# Patient Record
Sex: Male | Born: 1995 | Race: White | Hispanic: No | Marital: Single | Smoking: Never smoker
Health system: Southern US, Community
[De-identification: ages and names within clinical notes are randomized; demographics above are authoritative.]

## PROBLEM LIST (undated history)

## (undated) DIAGNOSIS — F419 Anxiety disorder, unspecified: Secondary | ICD-10-CM

## (undated) DIAGNOSIS — I471 Supraventricular tachycardia: Secondary | ICD-10-CM

## (undated) DIAGNOSIS — F988 Other specified behavioral and emotional disorders with onset usually occurring in childhood and adolescence: Secondary | ICD-10-CM

## (undated) HISTORY — PX: WISDOM TOOTH EXTRACTION: SHX21

## (undated) HISTORY — PX: KNEE SURGERY: SHX244

## (undated) HISTORY — PX: APPENDECTOMY: SHX54

---
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
## 2017-01-13 ENCOUNTER — Observation Stay (HOSPITAL_BASED_OUTPATIENT_CLINIC_OR_DEPARTMENT_OTHER)
Admission: EM | Admit: 2017-01-13 | Discharge: 2017-01-14 | Disposition: A | Discharge status: Home / Self Care | Payer: 59 | Attending: General Surgery | Admitting: General Surgery

## 2017-01-13 ENCOUNTER — Inpatient Hospital Stay (HOSPITAL_COMMUNITY): Payer: 59 | Admitting: Anesthesiology

## 2017-01-13 ENCOUNTER — Encounter (HOSPITAL_COMMUNITY): Admission: EM | Disposition: A | Discharge status: Home / Self Care | Payer: Self-pay | Source: Home / Self Care

## 2017-01-13 ENCOUNTER — Encounter (HOSPITAL_BASED_OUTPATIENT_CLINIC_OR_DEPARTMENT_OTHER): Payer: Self-pay | Admitting: *Deleted

## 2017-01-13 ENCOUNTER — Emergency Department (HOSPITAL_BASED_OUTPATIENT_CLINIC_OR_DEPARTMENT_OTHER): Payer: 59

## 2017-01-13 DIAGNOSIS — K353 Acute appendicitis with localized peritonitis, without perforation or gangrene: Secondary | ICD-10-CM | POA: Diagnosis present

## 2017-01-13 DIAGNOSIS — F419 Anxiety disorder, unspecified: Secondary | ICD-10-CM | POA: Insufficient documentation

## 2017-01-13 DIAGNOSIS — K3580 Unspecified acute appendicitis: Secondary | ICD-10-CM | POA: Diagnosis present

## 2017-01-13 DIAGNOSIS — Z882 Allergy status to sulfonamides status: Secondary | ICD-10-CM | POA: Diagnosis not present

## 2017-01-13 DIAGNOSIS — F988 Other specified behavioral and emotional disorders with onset usually occurring in childhood and adolescence: Secondary | ICD-10-CM | POA: Diagnosis not present

## 2017-01-13 DIAGNOSIS — K381 Appendicular concretions: Secondary | ICD-10-CM | POA: Insufficient documentation

## 2017-01-13 HISTORY — PX: LAPAROSCOPIC APPENDECTOMY: SHX408

## 2017-01-13 LAB — URINALYSIS, ROUTINE W REFLEX MICROSCOPIC
BILIRUBIN URINE: NEGATIVE
Glucose, UA: NEGATIVE mg/dL
HGB URINE DIPSTICK: NEGATIVE
Ketones, ur: NEGATIVE mg/dL
Leukocytes, UA: NEGATIVE
NITRITE: NEGATIVE
PROTEIN: NEGATIVE mg/dL
SPECIFIC GRAVITY, URINE: 1.024 (ref 1.005–1.030)
pH: 8 (ref 5.0–8.0)

## 2017-01-13 LAB — BASIC METABOLIC PANEL
Anion gap: 7 (ref 5–15)
BUN: 14 mg/dL (ref 6–20)
CALCIUM: 9.3 mg/dL (ref 8.9–10.3)
CO2: 26 mmol/L (ref 22–32)
CREATININE: 0.92 mg/dL (ref 0.61–1.24)
Chloride: 106 mmol/L (ref 101–111)
Glucose, Bld: 119 mg/dL — ABNORMAL HIGH (ref 65–99)
Potassium: 3.6 mmol/L (ref 3.5–5.1)
SODIUM: 139 mmol/L (ref 135–145)

## 2017-01-13 LAB — CBC WITH DIFFERENTIAL/PLATELET
BASOS ABS: 0 10*3/uL (ref 0.0–0.1)
BASOS PCT: 0 %
EOS ABS: 0.1 10*3/uL (ref 0.0–0.7)
EOS PCT: 1 %
HCT: 43.4 % (ref 39.0–52.0)
HEMOGLOBIN: 15.5 g/dL (ref 13.0–17.0)
Lymphocytes Relative: 10 %
Lymphs Abs: 1.3 10*3/uL (ref 0.7–4.0)
MCH: 30.6 pg (ref 26.0–34.0)
MCHC: 35.7 g/dL (ref 30.0–36.0)
MCV: 85.8 fL (ref 78.0–100.0)
Monocytes Absolute: 0.8 10*3/uL (ref 0.1–1.0)
Monocytes Relative: 6 %
NEUTROS PCT: 83 %
Neutro Abs: 10.7 10*3/uL — ABNORMAL HIGH (ref 1.7–7.7)
PLATELETS: 202 10*3/uL (ref 150–400)
RBC: 5.06 MIL/uL (ref 4.22–5.81)
RDW: 12.9 % (ref 11.5–15.5)
WBC: 12.9 10*3/uL — AB (ref 4.0–10.5)

## 2017-01-13 SURGERY — APPENDECTOMY, LAPAROSCOPIC
Anesthesia: General

## 2017-01-13 MED ORDER — SUGAMMADEX SODIUM 200 MG/2ML IV SOLN
INTRAVENOUS | Status: DC | PRN
  Administered 2017-01-13: 170 mg via INTRAVENOUS

## 2017-01-13 MED ORDER — DEXAMETHASONE SODIUM PHOSPHATE 10 MG/ML IJ SOLN
INTRAMUSCULAR | Status: DC | PRN
  Administered 2017-01-13: 10 mg via INTRAVENOUS

## 2017-01-13 MED ORDER — SUCCINYLCHOLINE CHLORIDE 200 MG/10ML IV SOSY
PREFILLED_SYRINGE | INTRAVENOUS | Status: DC | PRN
  Administered 2017-01-13: 160 mg via INTRAVENOUS

## 2017-01-13 MED ORDER — ONDANSETRON HCL 4 MG/2ML IJ SOLN
4.0000 mg | Freq: Three times a day (TID) | INTRAMUSCULAR | Status: DC | PRN
  Administered 2017-01-13: 4 mg via INTRAVENOUS
  Filled 2017-01-13: qty 2

## 2017-01-13 MED ORDER — MIDAZOLAM HCL 2 MG/2ML IJ SOLN
INTRAMUSCULAR | Status: AC
  Filled 2017-01-13: qty 2

## 2017-01-13 MED ORDER — ROCURONIUM BROMIDE 10 MG/ML (PF) SYRINGE
PREFILLED_SYRINGE | INTRAVENOUS | Status: DC | PRN
Start: 2017-01-13 — End: 2017-01-13
  Administered 2017-01-13: 35 mg via INTRAVENOUS
  Administered 2017-01-13: 5 mg via INTRAVENOUS
  Administered 2017-01-13: 10 mg via INTRAVENOUS

## 2017-01-13 MED ORDER — HYDROMORPHONE HCL 1 MG/ML IJ SOLN: 0.2500 mg | INTRAMUSCULAR | Status: DC | PRN

## 2017-01-13 MED ORDER — ENOXAPARIN SODIUM 40 MG/0.4ML ~~LOC~~ SOLN
40.0000 mg | SUBCUTANEOUS | Status: DC
  Administered 2017-01-14: 40 mg via SUBCUTANEOUS
  Filled 2017-01-13: qty 0.4

## 2017-01-13 MED ORDER — FLUOXETINE HCL 20 MG PO CAPS: 40.0000 mg | ORAL_CAPSULE | Freq: Every day | ORAL | Status: DC

## 2017-01-13 MED ORDER — ONDANSETRON HCL 4 MG/2ML IJ SOLN
INTRAMUSCULAR | Status: DC | PRN
  Administered 2017-01-13: 4 mg via INTRAVENOUS

## 2017-01-13 MED ORDER — LACTATED RINGERS IR SOLN
Status: DC | PRN
  Administered 2017-01-13: 1000 mL

## 2017-01-13 MED ORDER — LIDOCAINE 2% (20 MG/ML) 5 ML SYRINGE
INTRAMUSCULAR | Status: DC | PRN
  Administered 2017-01-13: 50 mg via INTRAVENOUS

## 2017-01-13 MED ORDER — HYDROMORPHONE HCL 2 MG/ML IJ SOLN: 1.0000 mg | INTRAMUSCULAR | Status: AC | PRN

## 2017-01-13 MED ORDER — IOPAMIDOL (ISOVUE-300) INJECTION 61%
100.0000 mL | Freq: Once | INTRAVENOUS | Status: AC | PRN
  Administered 2017-01-13: 100 mL via INTRAVENOUS

## 2017-01-13 MED ORDER — 0.9 % SODIUM CHLORIDE (POUR BTL) OPTIME
TOPICAL | Status: DC | PRN
  Administered 2017-01-13: 1000 mL

## 2017-01-13 MED ORDER — ACETAMINOPHEN 10 MG/ML IV SOLN
INTRAVENOUS | Status: DC | PRN
  Administered 2017-01-13: 1000 mg via INTRAVENOUS

## 2017-01-13 MED ORDER — ONDANSETRON HCL 4 MG/2ML IJ SOLN
4.0000 mg | Freq: Once | INTRAMUSCULAR | Status: AC
  Administered 2017-01-13: 4 mg via INTRAVENOUS
  Filled 2017-01-13: qty 2

## 2017-01-13 MED ORDER — PROPOFOL 10 MG/ML IV BOLUS
INTRAVENOUS | Status: AC
  Filled 2017-01-13: qty 20

## 2017-01-13 MED ORDER — MORPHINE SULFATE (PF) 4 MG/ML IV SOLN
4.0000 mg | Freq: Once | INTRAVENOUS | Status: AC
  Administered 2017-01-13: 4 mg via INTRAVENOUS
  Filled 2017-01-13: qty 1

## 2017-01-13 MED ORDER — LACTATED RINGERS IV SOLN
INTRAVENOUS | Status: DC
  Administered 2017-01-13: 1000 mL via INTRAVENOUS

## 2017-01-13 MED ORDER — ACETAMINOPHEN 10 MG/ML IV SOLN
INTRAVENOUS | Status: AC
  Filled 2017-01-13: qty 100

## 2017-01-13 MED ORDER — ACETAMINOPHEN 10 MG/ML IV SOLN: 1000.0000 mg | Freq: Once | INTRAVENOUS | Status: DC

## 2017-01-13 MED ORDER — DEXTROSE 5 % IV SOLN
2.0000 g | Freq: Once | INTRAVENOUS | Status: AC
  Administered 2017-01-13: 2 g via INTRAVENOUS
  Filled 2017-01-13: qty 2

## 2017-01-13 MED ORDER — PROPOFOL 10 MG/ML IV BOLUS
INTRAVENOUS | Status: DC | PRN
  Administered 2017-01-13: 180 mg via INTRAVENOUS

## 2017-01-13 MED ORDER — ONDANSETRON 4 MG PO TBDP: 4.0000 mg | ORAL_TABLET | Freq: Four times a day (QID) | ORAL | Status: DC | PRN

## 2017-01-13 MED ORDER — SODIUM CHLORIDE 0.9 % IV BOLUS (SEPSIS)
1000.0000 mL | Freq: Once | INTRAVENOUS | Status: AC
  Administered 2017-01-13: 1000 mL via INTRAVENOUS

## 2017-01-13 MED ORDER — MEPERIDINE HCL 50 MG/ML IJ SOLN
INTRAMUSCULAR | Status: AC
  Filled 2017-01-13: qty 1

## 2017-01-13 MED ORDER — HYDROCODONE-ACETAMINOPHEN 5-325 MG PO TABS
1.0000 | ORAL_TABLET | ORAL | Status: DC | PRN
  Administered 2017-01-13 – 2017-01-14 (×3): 2 via ORAL
  Filled 2017-01-13 (×3): qty 2

## 2017-01-13 MED ORDER — LACTATED RINGERS IV SOLN
INTRAVENOUS | Status: DC | PRN
  Administered 2017-01-13: 18:00:00 via INTRAVENOUS

## 2017-01-13 MED ORDER — MIDAZOLAM HCL 5 MG/5ML IJ SOLN
INTRAMUSCULAR | Status: DC | PRN
  Administered 2017-01-13: 2 mg via INTRAVENOUS

## 2017-01-13 MED ORDER — FENTANYL CITRATE (PF) 100 MCG/2ML IJ SOLN
INTRAMUSCULAR | Status: DC | PRN
  Administered 2017-01-13 (×2): 50 ug via INTRAVENOUS
  Administered 2017-01-13: 100 ug via INTRAVENOUS
  Administered 2017-01-13: 50 ug via INTRAVENOUS

## 2017-01-13 MED ORDER — FENTANYL CITRATE (PF) 250 MCG/5ML IJ SOLN
INTRAMUSCULAR | Status: AC
  Filled 2017-01-13: qty 5

## 2017-01-13 MED ORDER — METRONIDAZOLE 500 MG PO TABS
500.0000 mg | ORAL_TABLET | Freq: Three times a day (TID) | ORAL | Status: DC
  Administered 2017-01-13: 500 mg via ORAL
  Filled 2017-01-13: qty 1

## 2017-01-13 MED ORDER — HYDROMORPHONE HCL 1 MG/ML IJ SOLN
1.0000 mg | INTRAMUSCULAR | Status: DC | PRN
  Administered 2017-01-13: 1 mg via INTRAVENOUS
  Filled 2017-01-13: qty 1

## 2017-01-13 MED ORDER — DEXTROSE 5 % IV SOLN
1.0000 g | Freq: Once | INTRAVENOUS | Status: DC
  Filled 2017-01-13: qty 10

## 2017-01-13 MED ORDER — BUPIVACAINE HCL (PF) 0.25 % IJ SOLN
INTRAMUSCULAR | Status: DC | PRN
  Administered 2017-01-13: 15 mL

## 2017-01-13 MED ORDER — ONDANSETRON HCL 4 MG/2ML IJ SOLN: 4.0000 mg | Freq: Four times a day (QID) | INTRAMUSCULAR | Status: DC | PRN

## 2017-01-13 MED ORDER — BUPIVACAINE HCL (PF) 0.25 % IJ SOLN
INTRAMUSCULAR | Status: AC
  Filled 2017-01-13: qty 30

## 2017-01-13 MED ORDER — MEPERIDINE HCL 25 MG/ML IJ SOLN
INTRAMUSCULAR | Status: DC | PRN
  Administered 2017-01-13: 25 mg via INTRAVENOUS

## 2017-01-13 SURGICAL SUPPLY — 33 items
APPLIER CLIP 5 13 M/L LIGAMAX5 (MISCELLANEOUS)
APPLIER CLIP ROT 10 11.4 M/L (STAPLE)
CABLE HIGH FREQUENCY MONO STRZ (ELECTRODE) IMPLANT
CHLORAPREP W/TINT 26ML (MISCELLANEOUS) ×3 IMPLANT
CLIP APPLIE 5 13 M/L LIGAMAX5 (MISCELLANEOUS) IMPLANT
CLIP APPLIE ROT 10 11.4 M/L (STAPLE) IMPLANT
COVER SURGICAL LIGHT HANDLE (MISCELLANEOUS) IMPLANT
CUTTER FLEX LINEAR 45M (STAPLE) ×3 IMPLANT
DECANTER SPIKE VIAL GLASS SM (MISCELLANEOUS) ×3 IMPLANT
DRAPE LAPAROSCOPIC ABDOMINAL (DRAPES) ×3 IMPLANT
ELECT REM PT RETURN 9FT ADLT (ELECTROSURGICAL) ×3
ELECTRODE REM PT RTRN 9FT ADLT (ELECTROSURGICAL) ×1 IMPLANT
GLOVE BIOGEL PI IND STRL 7.5 (GLOVE) ×1 IMPLANT
GLOVE BIOGEL PI INDICATOR 7.5 (GLOVE) ×2
GLOVE ECLIPSE 7.5 STRL STRAW (GLOVE) ×3 IMPLANT
GOWN STRL REUS W/TWL XL LVL3 (GOWN DISPOSABLE) ×6 IMPLANT
IRRIG SUCT STRYKERFLOW 2 WTIP (MISCELLANEOUS) ×3
IRRIGATION SUCT STRKRFLW 2 WTP (MISCELLANEOUS) ×1 IMPLANT
KIT BASIN OR (CUSTOM PROCEDURE TRAY) ×3 IMPLANT
LIQUID BAND (GAUZE/BANDAGES/DRESSINGS) ×3 IMPLANT
POUCH SPECIMEN RETRIEVAL 10MM (ENDOMECHANICALS) ×3 IMPLANT
RELOAD 45 VASCULAR/THIN (ENDOMECHANICALS) IMPLANT
RELOAD STAPLE TA45 3.5 REG BLU (ENDOMECHANICALS) ×3 IMPLANT
SCISSORS LAP 5X35 DISP (ENDOMECHANICALS) ×3 IMPLANT
SHEARS HARMONIC ACE PLUS 36CM (ENDOMECHANICALS) ×3 IMPLANT
SLEEVE XCEL OPT CAN 5 100 (ENDOMECHANICALS) ×3 IMPLANT
SUT MNCRL AB 4-0 PS2 18 (SUTURE) ×3 IMPLANT
TOWEL OR 17X26 10 PK STRL BLUE (TOWEL DISPOSABLE) ×3 IMPLANT
TRAY FOLEY W/METER SILVER 16FR (SET/KITS/TRAYS/PACK) ×3 IMPLANT
TRAY LAPAROSCOPIC (CUSTOM PROCEDURE TRAY) ×3 IMPLANT
TROCAR BLADELESS OPT 5 100 (ENDOMECHANICALS) ×3 IMPLANT
TROCAR XCEL BLUNT TIP 100MML (ENDOMECHANICALS) ×3 IMPLANT
TUBING INSUF HEATED (TUBING) ×3 IMPLANT

## 2017-01-13 NOTE — ED Notes (Signed)
Family at bedside. 

## 2017-01-13 NOTE — Anesthesia Postprocedure Evaluation (Signed)
Anesthesia Post Note  Patient: Andre ButtersJohn Decarli  Procedure(s) Performed: Procedure(s) (LRB): APPENDECTOMY LAPAROSCOPIC (N/A)  Patient location during evaluation: PACU Anesthesia Type: General Level of consciousness: awake Pain management: pain level controlled Vital Signs Assessment: post-procedure vital signs reviewed and stable Respiratory status: spontaneous breathing Cardiovascular status: stable Anesthetic complications: no       Last Vitals:  Vitals:   01/13/17 1900 01/13/17 1915  BP: 121/64 127/75  Pulse: 91 83  Resp: 19 17  Temp:      Last Pain:  Vitals:   01/13/17 1900  TempSrc:   PainSc: Asleep                 Kaari Zeigler

## 2017-01-13 NOTE — ED Triage Notes (Signed)
Woke with abdominal pain. No nausea, diarrhea or vomiting.

## 2017-01-13 NOTE — Anesthesia Procedure Notes (Signed)
Procedure Name: Intubation Performed by: Anne Fu Pre-anesthesia Checklist: Patient identified, Emergency Drugs available, Suction available, Patient being monitored and Timeout performed Patient Re-evaluated:Patient Re-evaluated prior to inductionOxygen Delivery Method: Circle system utilized Preoxygenation: Pre-oxygenation with 100% oxygen Intubation Type: IV induction, Rapid sequence and Cricoid Pressure applied Laryngoscope Size: Mac and 4 Grade View: Grade I Tube type: Oral Tube size: 7.5 mm Number of attempts: 1 Airway Equipment and Method: Stylet Placement Confirmation: ETT inserted through vocal cords under direct vision,  positive ETCO2,  CO2 detector and breath sounds checked- equal and bilateral Secured at: 23 cm Tube secured with: Tape Dental Injury: Teeth and Oropharynx as per pre-operative assessment

## 2017-01-13 NOTE — ED Notes (Signed)
ED Provider at bedside. 

## 2017-01-13 NOTE — ED Notes (Signed)
[facility] here for transport 

## 2017-01-13 NOTE — Anesthesia Preprocedure Evaluation (Addendum)
Anesthesia Evaluation  Patient identified by MRN, date of birth, ID band Patient awake    Reviewed: Allergy & Precautions, NPO status , Patient's Chart, lab work & pertinent test results  Airway Mallampati: I  TM Distance: >3 FB     Dental   Pulmonary neg pulmonary ROS,    breath sounds clear to auscultation       Cardiovascular negative cardio ROS   Rhythm:Regular Rate:Normal     Neuro/Psych    GI/Hepatic Neg liver ROS, History noted. CG   Endo/Other  negative endocrine ROS  Renal/GU negative Renal ROS     Musculoskeletal   Abdominal   Peds  Hematology   Anesthesia Other Findings   Reproductive/Obstetrics                             Anesthesia Physical Anesthesia Plan  ASA: I and emergent  Anesthesia Plan: General   Post-op Pain Management:    Induction: Intravenous, Rapid sequence and Cricoid pressure planned  Airway Management Planned: Oral ETT  Additional Equipment:   Intra-op Plan:   Post-operative Plan: Extubation in OR  Informed Consent: I have reviewed the patients History and Physical, chart, labs and discussed the procedure including the risks, benefits and alternatives for the proposed anesthesia with the patient or authorized representative who has indicated his/her understanding and acceptance.   Dental advisory given  Plan Discussed with: CRNA, Anesthesiologist and Surgeon  Anesthesia Plan Comments:         Anesthesia Quick Evaluation

## 2017-01-13 NOTE — H&P (Signed)
Andre Peterson is an 21 y.o. male.    Chief Complaint: Abdominal pain HPI: Patient is a generally healthy 21 year old male who woke up this morning with the gradual onset of worsening mid abdominal pain that has become more localized in the lower right side. This is associated with some nausea but no vomiting. Worse with motion. He has no history of any similar or recurring pain or any chronic GI complaints. No change in bowel habits or GU symptoms.  Past Medical History:  Diagnosis Date  . Anxiety   . Attention deficit disorder     Past Surgical History:  Procedure Laterality Date  . KNEE SURGERY      History reviewed. No pertinent family history. Social History:  reports that he has never smoked. He has never used smokeless tobacco. He reports that he does not drink alcohol. His drug history is not on file.  Allergies:  Allergies  Allergen Reactions  . Sulfa Antibiotics     Using eyedrops and his eyes became itchy    Medications Prior to Admission  Medication Sig Dispense Refill  . FLUoxetine (PROZAC) 40 MG capsule Take 40 mg by mouth daily.    . naphazoline-pheniramine (NAPHCON-A) 0.025-0.3 % ophthalmic solution Place 1 drop into both eyes every 6 (six) hours as needed for irritation. 15 mL 0    Results for orders placed or performed during the hospital encounter of 01/13/17 (from the past 48 hour(s))  Urinalysis, Routine w reflex microscopic     Status: None   Collection Time: 01/13/17  1:25 PM  Result Value Ref Range   Color, Urine YELLOW YELLOW   APPearance CLEAR CLEAR   Specific Gravity, Urine 1.024 1.005 - 1.030   pH 8.0 5.0 - 8.0   Glucose, UA NEGATIVE NEGATIVE mg/dL   Hgb urine dipstick NEGATIVE NEGATIVE   Bilirubin Urine NEGATIVE NEGATIVE   Ketones, ur NEGATIVE NEGATIVE mg/dL   Protein, ur NEGATIVE NEGATIVE mg/dL   Nitrite NEGATIVE NEGATIVE   Leukocytes, UA NEGATIVE NEGATIVE    Comment: Microscopic not done on urines with negative protein, blood, leukocytes,  nitrite, or glucose < 500 mg/dL.  Basic metabolic panel     Status: Abnormal   Collection Time: 01/13/17  2:09 PM  Result Value Ref Range   Sodium 139 135 - 145 mmol/L   Potassium 3.6 3.5 - 5.1 mmol/L   Chloride 106 101 - 111 mmol/L   CO2 26 22 - 32 mmol/L   Glucose, Bld 119 (H) 65 - 99 mg/dL   BUN 14 6 - 20 mg/dL   Creatinine, Ser 0.92 0.61 - 1.24 mg/dL   Calcium 9.3 8.9 - 10.3 mg/dL   GFR calc non Af Amer >60 >60 mL/min   GFR calc Af Amer >60 >60 mL/min    Comment: (NOTE) The eGFR has been calculated using the CKD EPI equation. This calculation has not been validated in all clinical situations. eGFR's persistently <60 mL/min signify possible Chronic Kidney Disease.    Anion gap 7 5 - 15  CBC with Differential     Status: Abnormal   Collection Time: 01/13/17  2:09 PM  Result Value Ref Range   WBC 12.9 (H) 4.0 - 10.5 K/uL   RBC 5.06 4.22 - 5.81 MIL/uL   Hemoglobin 15.5 13.0 - 17.0 g/dL   HCT 43.4 39.0 - 52.0 %   MCV 85.8 78.0 - 100.0 fL   MCH 30.6 26.0 - 34.0 pg   MCHC 35.7 30.0 - 36.0 g/dL   RDW  12.9 11.5 - 15.5 %   Platelets 202 150 - 400 K/uL   Neutrophils Relative % 83 %   Neutro Abs 10.7 (H) 1.7 - 7.7 K/uL   Lymphocytes Relative 10 %   Lymphs Abs 1.3 0.7 - 4.0 K/uL   Monocytes Relative 6 %   Monocytes Absolute 0.8 0.1 - 1.0 K/uL   Eosinophils Relative 1 %   Eosinophils Absolute 0.1 0.0 - 0.7 K/uL   Basophils Relative 0 %   Basophils Absolute 0.0 0.0 - 0.1 K/uL   Ct Abdomen Pelvis W Contrast  Result Date: 01/13/2017 CLINICAL DATA:  Lower abdominal pain starting early this morning, nausea EXAM: CT ABDOMEN AND PELVIS WITH CONTRAST TECHNIQUE: Multidetector CT imaging of the abdomen and pelvis was performed using the standard protocol following bolus administration of intravenous contrast. CONTRAST:  156m ISOVUE-300 IOPAMIDOL (ISOVUE-300) INJECTION 61% COMPARISON:  None. FINDINGS: Lower chest: Lung bases shows no acute findings. Hepatobiliary: Enhanced liver shows no  focal mass. No biliary ductal dilatation. Gallbladder is contracted without evidence of calcified gallstones. Pancreas: Enhanced pancreas with normal appearance. No surrounding inflammatory changes. Spleen: Enhanced spleen without acute findings. Adrenals/Urinary Tract: No adrenal gland mass. Enhanced kidneys are symmetrical in size. No hydronephrosis or hydroureter. Stomach/Bowel: There is no gastric outlet obstruction. Oral contrast material was given to the patient. Axial image 65 mild fluid distended small bowel loops in right lower quadrant probable mild ileus. No transition point in caliber of small bowel to suggest small bowel obstruction. Although the base of the appendix has normal appearance in coronal image 38 the distal appendix is thickened mild distended with fluid measures up to 8 mm in diameter. Please see coronal image 39. There is a calcified appendicolith within mid appendiceal lumen axial image 62 measures 3.5 mm. Findings are consistent with early tip appendicitis. Vascular/Lymphatic: No aortic aneurysm.  No adenopathy. Reproductive: Prostate gland and seminal vesicles are unremarkable. Limited assessment of urinary bladder which is empty. Other: No ascites or free abdominal air.  No inguinal adenopathy. Musculoskeletal: No destructive bony lesions are noted. Sagittal images of the spine are unremarkable. IMPRESSION: 1. There is mild distended distal appendix with fluid and minimal stranding of surrounding fat. Mild enhancement of the wall of distal appendix .A calcified appendicolith is noted in mid appendix measures 3.5 mm. Findings are consistent with early tip appendicitis. Distal appendix measures 8 mm in diameter. The proximal appendix/ base has normal appearance. No evidence of perforation. 2. Mild fluid distended small bowel loops in right lower abdomen probable mild ileus. No evidence of small bowel obstruction. 3. No hydronephrosis or hydroureter. 4. Limited assessment of urinary  bladder which is empty. These results were called by telephone at the time of interpretation on 01/13/2017 at 2:53 pm to Dr. SSherwood Gambler, who verbally acknowledged these results. Electronically Signed   By: LLahoma CrockerM.D.   On: 01/13/2017 14:54    Review of Systems  Constitutional: Negative for chills and fever.  Respiratory: Negative.   Cardiovascular: Negative.   Gastrointestinal: Positive for abdominal pain and nausea. Negative for vomiting.  Genitourinary: Negative.     Blood pressure 131/72, pulse 84, temperature 98.1 F (36.7 C), temperature source Oral, resp. rate 18, height '5\' 11"'$  (1.803 m), weight 85.7 kg (189 lb), SpO2 98 %. Physical Exam  General: Alert, well-developed young Caucasian male, in no distress Skin: Warm and dry without rash or infection. HEENT: No palpable masses or thyromegaly. Sclera nonicteric. Pupils equal round and reactive. Lymph nodes: No cervical,  supraclavicular,nodes palpable. Lungs: Breath sounds clear and equal without increased work of breathing Cardiovascular: Regular rate and rhythm without murmur. No JVD or edema. Peripheral pulses intact. Abdomen: Nondistended. There is localized right lower quadrant tenderness with guarding. No masses palpable. No organomegaly. No palpable hernias. Extremities: No edema or joint swelling or deformity. No chronic venous stasis changes. Neurologic: Alert and fully oriented. Affect normal. No gross motor abnormalities.  Assessment/Plan Physical findings, history, labs and CT scan all consistent with acute appendicitis. I discussed options for treatment including antibiotics and surgery. I think laparoscopic appendectomy would be the most appropriate treatment and he is in agreement. We discussed the nature of surgery and recovery as well as risks of anesthetic complications, bleeding, infection or possible need for conversion to open surgery. All questions were answered and he desires to proceed. He has received  preoperative broad-spectrum IV antibiotics.  Edward Jolly, MD 01/13/2017, 5:29 PM

## 2017-01-13 NOTE — Op Note (Signed)
Preoperative Diagnosis: Acute appendicitis, unspecified acute appendicitis type [K35.80] Acute appendicitis [K35.80]  Postoprative Diagnosis: Acute appendicitis, unspecified acute appendicitis type [K35.80] Acute appendicitis [K35.80]  Procedure: Procedure(s): APPENDECTOMY LAPAROSCOPIC   Surgeon: Glenna FellowsHoxworth, Rishawn Walck T   Assistants: None  Anesthesia:  General endotracheal anesthesia  Indications: Patient is a 21 year old male with typical vertical presentation for acute appendicitis which has been confirmed by CT scan. Following a preoperative discussion detailed elsewhere we have elected to proceed with laparoscopic appendectomy.    Procedure Detail: Patient was brought to the operating room, placed in the supine position on the operating table, and general endotracheal anesthesia induced. He received preoperative IV antibiotics. Foley catheter was placed. The abdomen was widely sterilely prepped and draped. Patient timeout was performed and correct procedure verified. Trocar sites were infiltrated with local anesthesia. Access was attained with a 1/2 cm incision at the umbilicus with an open Hassan technique through a mattress suture of 0 Vicryl and pneumoperitoneum established. Under direct vision 5 mm trochars were placed in the left lower quadrant and upper midline. The appendix was lying anterior and medial and was acutely inflamed with exudate but no gangrene or perforation. The base was relatively uninflamed. The appendix was mobilized dividing lateral peritoneal attachments with the Harmonic scalpel. The mesial appendix was then sequentially divided with the Harmonic scalpel until the appendix was completely freed down to the tip of the cecum. The appendix was divided from the tip of the cecum with a single firing of the Endo GIA 45 mm blue load stapler. The staple line was intact and without bleeding. The right lower quadrant was thoroughly irrigated until clear. There was no evidence of  bleeding or injury or other problems. The appendix was placed in an Endo Catch bag and brought out through the umbilical incision. This incision was closed with a mattress suture and all CO2 evacuated and trochars removed. Skin incisions were closed with subcuticular 4-0 Monocryl and Liquiban. Sponge needle and instrument counts were correct.    Findings: Acute appendicitis without perforation or gangrene  Estimated Blood Loss:  Minimal         Drains: None  Blood Given: none          Specimens: Appendix        Complications:  * No complications entered in OR log *         Disposition: PACU - hemodynamically stable.         Condition: stable

## 2017-01-13 NOTE — Transfer of Care (Signed)
Immediate Anesthesia Transfer of Care Note  Patient: Andre ButtersJohn Peterson  Procedure(s) Performed: Procedure(s): APPENDECTOMY LAPAROSCOPIC (N/A)  Patient Location: PACU  Anesthesia Type:General  Level of Consciousness: awake, alert , oriented and patient cooperative  Airway & Oxygen Therapy: Patient Spontanous Breathing and Patient connected to face mask oxygen  Post-op Assessment: Report given to RN, Post -op Vital signs reviewed and stable and Patient moving all extremities X 4  Post vital signs: stable  Last Vitals:  Vitals:   01/13/17 1704 01/13/17 1847  BP: 131/72 (!) (P) 138/92  Pulse: 84 (!) 117  Resp: 18 16  Temp: 36.7 C (P) 37.2 C    Last Pain:  Vitals:   01/13/17 1704  TempSrc: Oral  PainSc: 3          Complications: No apparent anesthesia complications

## 2017-01-13 NOTE — Progress Notes (Signed)
Pt arrived at 1705 via [facility] and was then picked up by the OR at 1713. Admission work done as much as possible. Mother and father at bedside.

## 2017-01-13 NOTE — ED Provider Notes (Signed)
[facility] Provider [facility]   CSN: [id number] Arrival date & time: 01/13/17  1315     History   Chief Complaint Chief Complaint  Patient presents with  . Abdominal Pain    HPI Andre Peterson is a 21 y.o. male.  HPI  21 year old male presents with abdominal pain since waking up around 7 AM. Progressively worsened. He did eat this morning and states around 30 minutes later seemed to be worse. He had a bowel movement just prior to arrival but this did not change the pain. He has not had constipation or diarrhea recently. No urinary symptoms. He's had some nausea that he thinks his pain related. No vomiting. No back pain or fevers. Last night he went to bed feeling well. The pain is presently worsened and is now about a 7 or 8 out of 10. He tried a acid reliever with no relief. Pain feels sharp and stabbing. He points to just inferior to his umbilicus on both sides as the source of his pain.  Past Medical History:  Diagnosis Date  . Anxiety   . Attention deficit disorder     Patient Active Problem List   Diagnosis Date Noted  . Acute appendicitis 01/13/2017    Past Surgical History:  Procedure Laterality Date  . KNEE SURGERY         Home Medications    Prior to Admission medications   Medication Sig Start Date End Date Taking? Authorizing Provider  FLUoxetine (PROZAC) 40 MG capsule Take 40 mg by mouth daily.    Historical Provider, MD  naphazoline-pheniramine (NAPHCON-A) 0.025-0.3 % ophthalmic solution Place 1 drop into both eyes every 6 (six) hours as needed for irritation. 06/26/14   Hope Orlene OchM Neese, NP    Family History No family history on file.  Social History Social History  Substance Use Topics  . Smoking status: Never Smoker  . Smokeless tobacco: Never Used  . Alcohol use No     Allergies   Sulfa antibiotics   Review of Systems Review of Systems  Constitutional: Negative for fever.  Gastrointestinal: Positive for abdominal pain and nausea.  Negative for vomiting.  Genitourinary: Negative for discharge, dysuria, hematuria, penile pain, penile swelling, scrotal swelling and testicular pain.  Musculoskeletal: Negative for back pain.  All other systems reviewed and are negative.    Physical Exam Updated Vital Signs BP 125/65   Pulse 67   Temp 98 F (36.7 C) (Oral)   Resp 20   Ht 5\' 11"  (1.803 m)   Wt 189 lb (85.7 kg)   SpO2 100%   BMI 26.36 kg/m   Physical Exam  Constitutional: He is oriented to person, place, and time. He appears well-developed and well-nourished.  HENT:  Head: Normocephalic and atraumatic.  Right Ear: External ear normal.  Left Ear: External ear normal.  Nose: Nose normal.  Eyes: Right eye exhibits no discharge. Left eye exhibits no discharge.  Neck: Neck supple.  Cardiovascular: Normal rate, regular rhythm and normal heart sounds.   Pulmonary/Chest: Effort normal and breath sounds normal.  Abdominal: Soft. There is tenderness (worst in RLQ) in the right lower quadrant, suprapubic area and left lower quadrant. There is no CVA tenderness.  Genitourinary: Testes normal and penis normal. Right testis shows no mass, no swelling and no tenderness. Left testis shows no mass, no swelling and no tenderness. Circumcised. No penile tenderness. No discharge found.  Musculoskeletal: He exhibits no edema.  Neurological: He is alert and oriented to person, place, and  time.  Skin: Skin is warm and dry.  Nursing note and vitals reviewed.    ED Treatments / Results  Labs (all labs ordered are listed, but only abnormal results are displayed) Labs Reviewed  BASIC METABOLIC PANEL - Abnormal; Notable for the following:       Result Value   Glucose, Bld 119 (*)    All other components within normal limits  CBC WITH DIFFERENTIAL/PLATELET - Abnormal; Notable for the following:    WBC 12.9 (*)    Neutro Abs 10.7 (*)    All other components within normal limits  URINALYSIS, ROUTINE W REFLEX MICROSCOPIC     EKG  EKG Interpretation None       Radiology Ct Abdomen Pelvis W Contrast  Result Date: 01/13/2017 CLINICAL DATA:  Lower abdominal pain starting early this morning, nausea EXAM: CT ABDOMEN AND PELVIS WITH CONTRAST TECHNIQUE: Multidetector CT imaging of the abdomen and pelvis was performed using the standard protocol following bolus administration of intravenous contrast. CONTRAST:  ISOVUE-300 IOPAMIDOL (ISOVUE-300) INJECTION 61% COMPARISON:  None. FINDINGS: Lower chest: Lung bases shows no acute findings. Hepatobiliary: Enhanced liver shows no focal mass. No biliary ductal dilatation. Gallbladder is contracted without evidence of calcified gallstones. Pancreas: Enhanced pancreas with normal appearance. No surrounding inflammatory changes. Spleen: Enhanced spleen without acute findings. Adrenals/Urinary Tract: No adrenal gland mass. Enhanced kidneys are symmetrical in size. No hydronephrosis or hydroureter. Stomach/Bowel: There is no gastric outlet obstruction. Oral contrast material was given to the patient. Axial image 65 mild fluid distended small bowel loops in right lower quadrant probable mild ileus. No transition point in caliber of small bowel to suggest small bowel obstruction. Although the base of the appendix has normal appearance in coronal image 38 the distal appendix is thickened mild distended with fluid measures up to 8 mm in diameter. Please see coronal image 39. There is a calcified appendicolith within mid appendiceal lumen axial image 62 measures 3.5 mm. Findings are consistent with early tip appendicitis. Vascular/Lymphatic: No aortic aneurysm.  No adenopathy. Reproductive: Prostate gland and seminal vesicles are unremarkable. Limited assessment of urinary bladder which is empty. Other: No ascites or free abdominal air.  No inguinal adenopathy. Musculoskeletal: No destructive bony lesions are noted. Sagittal images of the spine are unremarkable. IMPRESSION: 1. There is mild  distended distal appendix with fluid and minimal stranding of surrounding fat. Mild enhancement of the wall of distal appendix .A calcified appendicolith is noted in mid appendix measures 3.5 mm. Findings are consistent with early tip appendicitis. Distal appendix measures 8 mm in diameter. The proximal appendix/ base has normal appearance. No evidence of perforation. 2. Mild fluid distended small bowel loops in right lower abdomen probable mild ileus. No evidence of small bowel obstruction. 3. No hydronephrosis or hydroureter. 4. Limited assessment of urinary bladder which is empty. These results were called by telephone at the time of interpretation on 01/13/2017 at 2:53 pm to Dr. Pricilla Loveless , who verbally acknowledged these results. Electronically Signed   By: Natasha Mead M.D.   On: 01/13/2017 14:54    Procedures Procedures (including critical care time)  Medications Ordered in ED Medications  cefTRIAXone (ROCEPHIN) 2 g in dextrose 5 % 50 mL IVPB (2 g Intravenous New Bag/Given 01/13/17 1518)    And  metroNIDAZOLE (FLAGYL) tablet 500 mg (500 mg Oral Given 01/13/17 1518)  ondansetron (ZOFRAN) injection 4 mg (4 mg Intravenous Given 01/13/17 1522)  HYDROmorphone (DILAUDID) injection 1 mg (1 mg Intravenous Given 01/13/17 1522)  morphine 4 MG/ML injection 4 mg (4 mg Intravenous Given 01/13/17 1419)  ondansetron (ZOFRAN) injection 4 mg (4 mg Intravenous Given 01/13/17 1419)  sodium chloride 0.9 % bolus 1,000 mL (1,000 mLs Intravenous New Bag/Given 01/13/17 1418)  iopamidol (ISOVUE-300) 61 % injection 100 mL (100 mLs Intravenous Contrast Given 01/13/17 1426)     Initial Impression / Assessment and Plan / ED Course  I have reviewed the triage vital signs and the nursing notes.  Pertinent labs & imaging results that were available during my care of the patient were reviewed by me and considered in my medical decision making (see chart for details).  Clinical Course as of Jan 14 1548  Mon Jan 13, 2017   1339 Given the worst of his pain is RLQ, will need to r/o appy. Not a classic presentation. Morphine/zofran for pain/nausea.  [SG]  1506 CT confirms early appendicitis. Consult surgery.  [SG]    Clinical Course User Index [SG] Pricilla Loveless, MD    Patient's workup is consistent with uncomplicated appendicitis. He was given IV antibiotics. Nothing by mouth. Discussed with Dr. Johna Sheriff through his OR nurse as he is currently not operation. Request a medical surgical bed for inpatient status.  Final Clinical Impressions(s) / ED Diagnoses   Final diagnoses:  Acute appendicitis, unspecified acute appendicitis type    New Prescriptions New Prescriptions   No medications on file     Pricilla Loveless, MD 01/13/17 1549

## 2017-01-14 ENCOUNTER — Encounter: Payer: Self-pay | Admitting: General Surgery

## 2017-01-14 ENCOUNTER — Encounter (HOSPITAL_COMMUNITY): Payer: Self-pay | Admitting: General Surgery

## 2017-01-14 MED ORDER — HYDROCODONE-ACETAMINOPHEN 5-325 MG PO TABS: 1.0000 | ORAL_TABLET | ORAL | 0 refills | Status: DC | PRN

## 2017-01-14 MED ORDER — IBUPROFEN 200 MG PO TABS: 600.0000 mg | ORAL_TABLET | Freq: Four times a day (QID) | ORAL | Status: DC | PRN

## 2017-01-14 MED ORDER — IBUPROFEN 200 MG PO TABS: ORAL_TABLET | ORAL | Status: DC

## 2017-01-14 MED ORDER — ACETAMINOPHEN 325 MG PO TABS: 650.0000 mg | ORAL_TABLET | Freq: Four times a day (QID) | ORAL | Status: DC | PRN

## 2017-01-14 MED ORDER — ACETAMINOPHEN 325 MG PO TABS: ORAL_TABLET | ORAL | Status: DC

## 2017-01-14 NOTE — Discharge Instructions (Signed)
[facility] ______[facility] °LAPAROSCOPIC SURGERY: POST OP INSTRUCTIONS °Always review your discharge instruction sheet given to you by the facility where your surgery was performed. °IF YOU HAVE DISABILITY OR FAMILY LEAVE FORMS, YOU MUST BRING THEM TO THE OFFICE FOR PROCESSING.   °DO NOT GIVE THEM TO YOUR DOCTOR. ° °1. A prescription for pain medication may be given to you upon discharge.  Take your pain medication as prescribed, if needed.  If narcotic pain medicine is not needed, then you may take acetaminophen (Tylenol) or ibuprofen (Advil) as needed. °2. Take your usually prescribed medications unless otherwise directed. °3. If you need a refill on your pain medication, please contact your pharmacy.  They will contact our office to request authorization. Prescriptions will not be filled after 5pm or on week-ends. °4. You should follow a light diet the first few days after arrival home, such as soup and crackers, etc.  Be sure to include lots of fluids daily. °5. Most patients will experience some swelling and bruising in the area of the incisions.  Ice packs will help.  Swelling and bruising can take several days to resolve.  °6. It is common to experience some constipation if taking pain medication after surgery.  Increasing fluid intake and taking a stool softener (such as Colace) will usually help or prevent this problem from occurring.  A mild laxative (Milk of Magnesia or Miralax) should be taken according to package instructions if there are no bowel movements after 48 hours. °7. Unless discharge instructions indicate otherwise, you may remove your bandages 24-48 hours after surgery, and you may shower at that time.  You may have steri-strips (small skin tapes) in place directly over the incision.  These strips should be left on the skin for 7-10 days.  If your surgeon used skin glue on the incision, you may shower in 24 hours.  The glue will flake off over the next 2-3 weeks.  Any sutures or  staples will be removed at the office during your follow-up visit. °8. ACTIVITIES:  You may resume regular (light) daily activities beginning the next day--such as daily self-care, walking, climbing stairs--gradually increasing activities as tolerated.  You may have sexual intercourse when it is comfortable.  Refrain from any heavy lifting or straining until approved by your doctor. °a. You may drive when you are no longer taking prescription pain medication, you can comfortably wear a seatbelt, and you can safely maneuver your car and apply brakes. °b. RETURN TO WORK:  __________________________________________________________ °9. You should see your doctor in the office for a follow-up appointment approximately 2-3 weeks after your surgery.  Make sure that you call for this appointment within a day or two after you arrive home to insure a convenient appointment time. °10. OTHER INSTRUCTIONS: __________________________________________________________________________________________________________________________ __________________________________________________________________________________________________________________________ °WHEN TO CALL YOUR DOCTOR: °1. Fever over 101.0 °2. Inability to urinate °3. Continued bleeding from incision. °4. Increased pain, redness, or drainage from the incision. °5. Increasing abdominal pain ° °The clinic staff is available to answer your questions during regular business hours.  Please don’t hesitate to call and ask to speak to one of the nurses for clinical concerns.  If you have a medical emergency, go to the nearest emergency room or call 911.  A surgeon from [facility] is always on call at the hospital. °[address], [address], [place], [place]  [address] ? [address] [address], [place],    [address] °[phone] ? [phone] ? FAX [phone] °Web site:   [email or web address] ° ° °Laparoscopic Appendectomy, Adult, Care After °Refer to  this sheet in the next few weeks. These instructions provide you with information about caring for yourself after your procedure. Your health care provider may also give you more specific instructions. Your treatment has been planned according to current medical practices, but problems sometimes occur. Call your health care provider if you have any problems or questions after your procedure. °What can I expect after the procedure? °After the procedure, it is common to have: °· A decrease in your energy level. °· Mild pain in the area where the surgical cuts (incisions) were made. °· Constipation. This can be caused by pain medicine and a decrease in your activity. °Follow these instructions at home: °Medicines  °· Take over-the-counter and prescription medicines only as told by your health care provider. °· Do not drive for 24 hours if you received a sedative. °· Do not drive or operate heavy machinery while taking prescription pain medicine. °· If you were prescribed an antibiotic medicine, take it as told by your health care provider. Do not stop taking the antibiotic even if you start to feel better. °Activity  °· For 3 weeks or as long as told by your health care provider: °¨ Do not lift anything that is heavier than 10 pounds (4.5 kg). °¨ Do not play contact sports. °· Gradually return to your normal activities. Ask your health care provider what activities are safe for you. °Bathing  °· Keep your incisions clean and dry. Clean them as often as told by your health care provider: °¨ Gently wash the incisions with soap and water. °¨ Rinse the incisions with water to remove all soap. °¨ Pat the incisions dry with a clean towel. Do not rub the incisions. °· You may take showers after 48 hours. °· Do not take baths, swim, or use hot tubs for 2 weeks or as told by your health care provider. °Incision care  °· Follow instructions from your healthcare provider about how to take care of your incisions. Make sure  you: °¨ Wash your hands with soap and water before you change your bandage (dressing). If soap and water are not available, use hand sanitizer. °¨ Change your dressing as told by your health care provider. °¨ Leave stitches (sutures), skin glue, or adhesive strips in place. These skin closures may need to stay in place for 2 weeks or longer. If adhesive strip edges start to loosen and curl up, you may trim the loose edges. Do not remove adhesive strips completely unless your health care provider tells you to do that. °· Check your incision areas every day for signs of infection. Check for: °¨ More redness, swelling, or pain. °¨ More fluid or blood. °¨ Warmth. °¨ Pus or a bad smell. °Other Instructions  °· If you were sent home with a drain, follow instructions from your health care provider about how to care for the drain and how to empty it. °· Take deep breaths. This helps to prevent your lungs from becoming inflamed. °· To relieve and prevent constipation: °¨ Drink plenty of fluids. °¨ Eat plenty of fruits and vegetables. °· Keep all follow-up visits as told by your health care provider. This is important. °Contact a health care provider if: °· You have more redness, swelling, or pain around an incision. °· You have more fluid or blood coming from an incision. °· Your incision feels warm to the touch. °· You have pus or a bad smell coming   from an incision or dressing.  Your incision edges break open after your sutures have been removed.  You have increasing pain in your shoulders.  You feel dizzy or you faint.  You develop shortness of breath.  You keep feeling nauseous or vomiting.  You have diarrhea or you cannot control your bowel functions.  You lose your appetite.  You develop swelling or pain in your legs. Get help right away if:  You have a fever.  You develop a rash.  You have difficulty breathing.  You have sharp pains in your chest. This information is not intended to replace  advice given to you by your health care provider. Make sure you discuss any questions you have with your health care provider. Document Released: 11/04/2005 Document Revised: 04/05/2016 Document Reviewed: 04/24/2015 Elsevier Interactive Patient Education  2017 [employer]  Laparoscopic Appendectomy, Adult, Care After Refer to this sheet in the next few weeks. These instructions provide you with information about caring for yourself after your procedure. Your health care provider may also give you more specific instructions. Your treatment has been planned according to current medical practices, but problems sometimes occur. Call your health care provider if you have any problems or questions after your procedure. What can I expect after the procedure? After the procedure, it is common to have:  A decrease in your energy level.  Mild pain in the area where the surgical cuts (incisions) were made.  Constipation. This can be caused by pain medicine and a decrease in your activity. Follow these instructions at home: Medicines   Take over-the-counter and prescription medicines only as told by your health care provider.  Do not drive for 24 hours if you received a sedative.  Do not drive or operate heavy machinery while taking prescription pain medicine.  If you were prescribed an antibiotic medicine, take it as told by your health care provider. Do not stop taking the antibiotic even if you start to feel better. Activity   For 3 weeks or as long as told by your health care provider:  Do not lift anything that is heavier than 10 pounds (4.5 kg).  Do not play contact sports.  Gradually return to your normal activities. Ask your health care provider what activities are safe for you. Bathing   Keep your incisions clean and dry. Clean them as often as told by your health care provider:  Gently wash the incisions with soap and water.  Rinse the incisions with water to remove all  soap.  Pat the incisions dry with a clean towel. Do not rub the incisions.  You may take showers after 48 hours.  Do not take baths, swim, or use hot tubs for 2 weeks or as told by your health care provider. Incision care   Follow instructions from your healthcare provider about how to take care of your incisions. Make sure you:  Wash your hands with soap and water before you change your bandage (dressing). If soap and water are not available, use hand sanitizer.  Change your dressing as told by your health care provider.  Leave stitches (sutures), skin glue, or adhesive strips in place. These skin closures may need to stay in place for 2 weeks or longer. If adhesive strip edges start to loosen and curl up, you may trim the loose edges. Do not remove adhesive strips completely unless your health care provider tells you to do that.  Check your incision areas every day for signs of infection. Check  for:  More redness, swelling, or pain.  More fluid or blood.  Warmth.  Pus or a bad smell. Other Instructions   If you were sent home with a drain, follow instructions from your health care provider about how to care for the drain and how to empty it.  Take deep breaths. This helps to prevent your lungs from becoming inflamed.  To relieve and prevent constipation:  Drink plenty of fluids.  Eat plenty of fruits and vegetables.  Keep all follow-up visits as told by your health care provider. This is important. Contact a health care provider if:  You have more redness, swelling, or pain around an incision.  You have more fluid or blood coming from an incision.  Your incision feels warm to the touch.  You have pus or a bad smell coming from an incision or dressing.  Your incision edges break open after your sutures have been removed.  You have increasing pain in your shoulders.  You feel dizzy or you faint.  You develop shortness of breath.  You keep feeling nauseous or  vomiting.  You have diarrhea or you cannot control your bowel functions.  You lose your appetite.  You develop swelling or pain in your legs. Get help right away if:  You have a fever.  You develop a rash.  You have difficulty breathing.  You have sharp pains in your chest. This information is not intended to replace advice given to you by your health care provider. Make sure you discuss any questions you have with your health care provider. Document Released: 11/04/2005 Document Revised: 04/05/2016 Document Reviewed: 04/24/2015 Elsevier Interactive Patient Education  2017 [employer]

## 2017-01-14 NOTE — Progress Notes (Signed)
1 Day Post-Op  Subjective: He looks great this a.m. port sites all look good. We plan to mobilize him, letting me breakfast, try PO meds, and home later this Am.  Objective: Vital signs in last 24 hours: Temp:  [97.6 F (36.4 C)-99 F (37.2 C)] 98 F (36.7 C) (02/27 0537) Pulse Rate:  [67-117] 84 (02/27 0537) Resp:  [16-20] 18 (02/27 0537) BP: (108-138)/(56-92) 109/56 (02/27 0537) SpO2:  [96 %-100 %] 98 % (02/27 0537) Weight:  [85.7 kg (189 lb)] 85.7 kg (189 lb) (02/26 1319)  240 PO 1500IV 1975 urine Afebrile, vital signs stable No labs this a.m.  Intake/Output from previous day: 02/26 0701 - 02/27 0700 In: 1778.8 [P.O.:240; I.V.:1438.8; IV Piggyback:100] Out: 1610 [RUEAV:4098; Blood:5] Intake/Output this shift: No intake/output data recorded.  General appearance: alert, cooperative and no distress GI: soft, non-tender; bowel sounds normal; no masses,  no organomegaly  Lab Results:   Recent Labs  01/13/17 1409  WBC 12.9*  HGB 15.5  HCT 43.4  PLT 202    BMET  Recent Labs  01/13/17 1409  NA 139  K 3.6  CL 106  CO2 26  GLUCOSE 119*  BUN 14  CREATININE 0.92  CALCIUM 9.3   PT/INR No results for input(s): LABPROT, INR in the last 72 hours.  No results for input(s): AST, ALT, ALKPHOS, BILITOT, PROT, ALBUMIN in the last 168 hours.   Lipase  No results found for: LIPASE   Studies/Results: Ct Abdomen Pelvis W Contrast  Result Date: 01/13/2017 CLINICAL DATA:  Lower abdominal pain starting early this morning, nausea EXAM: CT ABDOMEN AND PELVIS WITH CONTRAST TECHNIQUE: Multidetector CT imaging of the abdomen and pelvis was performed using the standard protocol following bolus administration of intravenous contrast. CONTRAST:  ISOVUE-300 IOPAMIDOL (ISOVUE-300) INJECTION 61% COMPARISON:  None. FINDINGS: Lower chest: Lung bases shows no acute findings. Hepatobiliary: Enhanced liver shows no focal mass. No biliary ductal dilatation. Gallbladder is contracted  without evidence of calcified gallstones. Pancreas: Enhanced pancreas with normal appearance. No surrounding inflammatory changes. Spleen: Enhanced spleen without acute findings. Adrenals/Urinary Tract: No adrenal gland mass. Enhanced kidneys are symmetrical in size. No hydronephrosis or hydroureter. Stomach/Bowel: There is no gastric outlet obstruction. Oral contrast material was given to the patient. Axial image 65 mild fluid distended small bowel loops in right lower quadrant probable mild ileus. No transition point in caliber of small bowel to suggest small bowel obstruction. Although the base of the appendix has normal appearance in coronal image 38 the distal appendix is thickened mild distended with fluid measures up to 8 mm in diameter. Please see coronal image 39. There is a calcified appendicolith within mid appendiceal lumen axial image 62 measures 3.5 mm. Findings are consistent with early tip appendicitis. Vascular/Lymphatic: No aortic aneurysm.  No adenopathy. Reproductive: Prostate gland and seminal vesicles are unremarkable. Limited assessment of urinary bladder which is empty. Other: No ascites or free abdominal air.  No inguinal adenopathy. Musculoskeletal: No destructive bony lesions are noted. Sagittal images of the spine are unremarkable. IMPRESSION: 1. There is mild distended distal appendix with fluid and minimal stranding of surrounding fat. Mild enhancement of the wall of distal appendix .A calcified appendicolith is noted in mid appendix measures 3.5 mm. Findings are consistent with early tip appendicitis. Distal appendix measures 8 mm in diameter. The proximal appendix/ base has normal appearance. No evidence of perforation. 2. Mild fluid distended small bowel loops in right lower abdomen probable mild ileus. No evidence of small bowel obstruction. 3.  No hydronephrosis or hydroureter. 4. Limited assessment of urinary bladder which is empty. These results were called by telephone at the  time of interpretation on 01/13/2017 at 2:53 pm to Dr. Pricilla LovelessSCOTT GOLDSTON , who verbally acknowledged these results. Electronically Signed   By: Natasha MeadLiviu  Pop M.D.   On: 01/13/2017 14:54   Prior to Admission medications   Medication Sig Start Date End Date Taking? Authorizing Provider  FLUoxetine (PROZAC) 40 MG capsule Take 40 mg by mouth daily.    Historical Provider, MD  naphazoline-pheniramine (NAPHCON-A) 0.025-0.3 % ophthalmic solution Place 1 drop into both eyes every 6 (six) hours as needed for irritation. Patient not taking: Reported on 01/13/2017 06/26/14   Janne NapoleonHope M Neese, NP    Medications: . acetaminophen  1,000 mg Intravenous Once  . enoxaparin (LOVENOX) injection  40 mg Subcutaneous Q24H  . FLUoxetine  40 mg Oral Daily   . lactated ringers 1,000 mL (01/13/17 2049)   Assessment/Plan Acute appendicitis Anxiety Attention deficit disorder FEN: IV fluids/soft diet ID: Ceftriaxone/Flagyl preop dose only DVT: Lovenox    Plan:  Home later today I have personally reviewed the patients medication history on the [place] controlled substance database.    LOS: 1 day    Andre Peterson 01/14/2017 [phone]

## 2017-01-16 NOTE — Discharge Summary (Signed)
Physician Discharge Summary  Patient ID: Andre ButtersJohn Peterson MRN: [id number] DOB/AGE: February 15, 1996 21 y.o.  Admit date: 01/13/2017 Discharge date: 01/15/2017  Admission Diagnoses:  Acute appendicitis Anxiety Attention deficit disorder  Discharge Diagnoses:  Same.  Active Problems:   Acute appendicitis   Acute appendicitis with localized peritonitis   PROCEDURES: *Laparoscopic appendectomy 01/13/17, Dr. Glenna FellowsBenjamin Hoxworth.  Hospital Course:   Patient is a generally healthy 21 year old male who woke up this morning with the gradual onset of worsening mid abdominal pain that has become more localized in the lower right side. This is associated with some nausea but no vomiting. Worse with motion. He has no history of any similar or recurring pain or any chronic GI complaints. No change in bowel habits or GU symptoms. [facility]. He was seen and taken to the operating room by Dr. Glenna FellowsBenjamin Hoxworth. He underwent laparoscopic appendectomy. He tolerated the procedure well. The following a.m. he was mobilized, his diet was advanced, and he was discharged home later that afternoon.  Condition ON discharge: Improved.  CBC Latest Ref Rng & Units 01/13/2017  WBC 4.0 - 10.5 K/uL 12.9(H)  Hemoglobin 13.0 - 17.0 g/dL 40.915.5  Hematocrit 81.139.0 - 52.0 % 43.4  Platelets 150 - 400 K/uL 202   CMP Latest Ref Rng & Units 01/13/2017  Glucose 65 - 99 mg/dL 914(N119(H)  BUN 6 - 20 mg/dL 14  Creatinine 8.290.61 - 5.621.24 mg/dL 1.300.92  Sodium 865135 - 784145 mmol/L 139  Potassium 3.5 - 5.1 mmol/L 3.6  Chloride 101 - 111 mmol/L 106  CO2 22 - 32 mmol/L 26  Calcium 8.9 - 10.3 mg/dL 9.3    He will follow-up in our clinic as noted below.  Condition ON discharge: Improved Disposition: 01-Home or Self Care   Allergies as of 01/14/2017      Reactions   Sulfa Antibiotics    Using eyedrops and his eyes became itchy      Medication List    TAKE these medications    acetaminophen 325 MG tablet Commonly known as:  TYLENOL He can take 2 tablets every 4 hours as needed. This is also in your prescribed medication. He cannot take more than 4000 mg per day safely.   FLUoxetine 40 MG capsule Commonly known as:  PROZAC Take 40 mg by mouth daily.   HYDROcodone-acetaminophen 5-325 MG tablet Commonly known as:  NORCO/VICODIN Take 1-2 tablets by mouth every 4 (four) hours as needed for moderate pain.   ibuprofen 200 MG tablet Commonly known as:  ADVIL,MOTRIN He can take 2 or 3 tablets every 6 hours as needed for pain. I would use this first, then the prescribed pain medication as needed. You can also alternate Tylenol with ibuprofen. He can by this at any drugstore.   naphazoline-pheniramine 0.025-0.3 % ophthalmic solution Commonly known as:  NAPHCON-A Place 1 drop into both eyes every 6 (six) hours as needed for irritation.      Follow-up Information    [facility] Follow up.   Specialty:  General Surgery Why:  Our office should call you with an appointment 3 weeks. You do not hear from them by tomorrow call and ask for an appointment with the"[facility] clinic. Pre-insurance information and photo ID. Check in 30 minutes early before your appointment. Contact information: [address] [place] [address] [phone]           Signed: Sherrie GeorgeJENNINGS,Andre Peterson 01/16/2017, 5:22 PM

## 2017-01-22 ENCOUNTER — Other Ambulatory Visit (HOSPITAL_COMMUNITY): Payer: Self-pay | Admitting: Surgery

## 2017-01-22 DIAGNOSIS — K651 Peritoneal abscess: Principal | ICD-10-CM

## 2017-01-22 DIAGNOSIS — T814XXA Infection following a procedure, initial encounter: Principal | ICD-10-CM

## 2017-01-22 DIAGNOSIS — IMO0001 Reserved for inherently not codable concepts without codable children: Secondary | ICD-10-CM

## 2017-01-23 ENCOUNTER — Ambulatory Visit (HOSPITAL_COMMUNITY)
Admission: RE | Admit: 2017-01-23 | Discharge: 2017-01-23 | Disposition: A | Discharge status: Home / Self Care | Payer: 59 | Source: Ambulatory Visit | Attending: Surgery | Admitting: Surgery

## 2017-01-23 ENCOUNTER — Encounter (HOSPITAL_COMMUNITY): Payer: Self-pay

## 2017-01-23 DIAGNOSIS — T814XXA Infection following a procedure, initial encounter: Secondary | ICD-10-CM | POA: Diagnosis present

## 2017-01-23 DIAGNOSIS — Z9049 Acquired absence of other specified parts of digestive tract: Secondary | ICD-10-CM | POA: Insufficient documentation

## 2017-01-23 DIAGNOSIS — K651 Peritoneal abscess: Secondary | ICD-10-CM | POA: Diagnosis present

## 2017-01-23 DIAGNOSIS — X58XXXA Exposure to other specified factors, initial encounter: Secondary | ICD-10-CM | POA: Insufficient documentation

## 2017-01-23 DIAGNOSIS — IMO0001 Reserved for inherently not codable concepts without codable children: Secondary | ICD-10-CM

## 2017-01-23 MED ORDER — IOPAMIDOL (ISOVUE-300) INJECTION 61%
INTRAVENOUS | Status: AC
  Filled 2017-01-23: qty 100

## 2017-01-23 MED ORDER — IOPAMIDOL (ISOVUE-300) INJECTION 61%
100.0000 mL | Freq: Once | INTRAVENOUS | Status: AC | PRN
  Administered 2017-01-23: 100 mL via INTRAVENOUS

## 2017-02-04 ENCOUNTER — Encounter (INDEPENDENT_AMBULATORY_CARE_PROVIDER_SITE_OTHER): Payer: Self-pay | Admitting: Physician Assistant

## 2018-12-07 ENCOUNTER — Other Ambulatory Visit: Payer: Self-pay

## 2018-12-07 ENCOUNTER — Encounter (HOSPITAL_BASED_OUTPATIENT_CLINIC_OR_DEPARTMENT_OTHER): Payer: Self-pay

## 2018-12-07 ENCOUNTER — Emergency Department (HOSPITAL_BASED_OUTPATIENT_CLINIC_OR_DEPARTMENT_OTHER)
Admission: EM | Admit: 2018-12-07 | Discharge: 2018-12-07 | Disposition: A | Discharge status: Home / Self Care | Payer: BLUE CROSS/BLUE SHIELD | Attending: Emergency Medicine | Admitting: Emergency Medicine

## 2018-12-07 DIAGNOSIS — R002 Palpitations: Secondary | ICD-10-CM | POA: Diagnosis present

## 2018-12-07 DIAGNOSIS — Z79899 Other long term (current) drug therapy: Secondary | ICD-10-CM | POA: Insufficient documentation

## 2018-12-07 DIAGNOSIS — I471 Supraventricular tachycardia: Secondary | ICD-10-CM | POA: Diagnosis not present

## 2018-12-07 HISTORY — DX: Supraventricular tachycardia: I47.1

## 2018-12-07 NOTE — ED Provider Notes (Signed)
[facility] EMERGENCY DEPARTMENT Provider Note   CSN: [id number] Arrival date & time: 12/07/18  1111     History   Chief Complaint Chief Complaint  Patient presents with  . Tachycardia    HPI Andre Peterson is a 23 y.o. male with a past medical history significant for SVT who presents today complaining of palpitations at home. Patient reports he had palpitations at home that lasted about 15 min this morning. They self resolved without any interventions. Patient denies any chest pain, SOB, dizziness during episodes. Patient is followed by Cardiology and had monitor for three months but no event was captured during that time. Patient is not on any Beta- blocker for his SVT.  HPI  Past Medical History:  Diagnosis Date  . Anxiety   . Attention deficit disorder   . SVT (supraventricular tachycardia) [facility](HCC)     Patient Active Problem List   Diagnosis Date Noted  . Acute appendicitis 01/13/2017  . Acute appendicitis with localized peritonitis 01/13/2017    Past Surgical History:  Procedure Laterality Date  . APPENDECTOMY    . KNEE SURGERY    . LAPAROSCOPIC APPENDECTOMY N/A 01/13/2017   Procedure: APPENDECTOMY LAPAROSCOPIC;  Surgeon: Glenna FellowsBenjamin Hoxworth, MD;  Location: [facility] ORS;  Service: General;  Laterality: N/A;        Home Medications    Prior to Admission medications   Medication Sig Start Date End Date Taking? Authorizing Provider  acetaminophen (TYLENOL) 325 MG tablet He can take 2 tablets every 4 hours as needed. This is also in your prescribed medication. He cannot take more than 4000 mg per day safely. 01/14/17   Sherrie GeorgeJennings, Willard, PA-C  FLUoxetine (PROZAC) 40 MG capsule Take 40 mg by mouth daily.    [provider]  HYDROcodone-acetaminophen (NORCO/VICODIN) 5-325 MG tablet Take 1-2 tablets by mouth every 4 (four) hours as needed for moderate pain. 01/14/17   Sherrie GeorgeJennings, Willard, PA-C  ibuprofen (ADVIL,MOTRIN) 200 MG tablet He can take 2 or 3 tablets every  6 hours as needed for pain. I would use this first, then the prescribed pain medication as needed. You can also alternate Tylenol with ibuprofen. He can by this at any drugstore. 01/14/17   Sherrie GeorgeJennings, Willard, PA-C  naphazoline-pheniramine (NAPHCON-A) 0.025-0.3 % ophthalmic solution Place 1 drop into both eyes every 6 (six) hours as needed for irritation. Patient not taking: Reported on 01/13/2017 06/26/14   Janne NapoleonNeese, Hope M, NP    Family History No family history on file.  Social History Social History   Tobacco Use  . Smoking status: Never Smoker  . Smokeless tobacco: Never Used  Substance Use Topics  . Alcohol use: No  . Drug use: Never     Allergies   Sulfa antibiotics   Review of Systems Review of Systems  Constitutional: Negative.   HENT: Negative.   Respiratory: Negative.   Cardiovascular: Positive for palpitations.  Gastrointestinal: Negative.   Genitourinary: Negative.   Musculoskeletal: Negative.   Neurological: Negative.      Physical Exam Updated Vital Signs BP 107/73 (BP Location: Right Arm)   Pulse 74   Temp 98 F (36.7 C) (Oral)   Resp 18   Ht 5\' 11"  (1.803 m)   Wt 97.5 kg   SpO2 97%   BMI 29.99 kg/m   Physical Exam Constitutional:      Appearance: He is normal weight.  HENT:     Head: Normocephalic and atraumatic.     Nose: Nose normal.     Mouth/Throat:  Mouth: Mucous membranes are moist.     Pharynx: Oropharynx is clear.  Eyes:     Extraocular Movements: Extraocular movements intact.     Pupils: Pupils are equal, round, and reactive to light.  Neck:     Musculoskeletal: Normal range of motion.  Cardiovascular:     Rate and Rhythm: Normal rate and regular rhythm.     Pulses: Normal pulses.     Heart sounds: Normal heart sounds.  Pulmonary:     Effort: Pulmonary effort is normal.     Breath sounds: Normal breath sounds.  Abdominal:     General: Abdomen is flat. Bowel sounds are normal.  Musculoskeletal: Normal range of motion.    Skin:    General: Skin is warm and dry.     Capillary Refill: Capillary refill takes less than 2 seconds.  Neurological:     General: No focal deficit present.     Mental Status: He is alert and oriented to person, place, and time.  Psychiatric:        Mood and Affect: Mood normal.      ED Treatments / Results  Labs (all labs ordered are listed, but only abnormal results are displayed) Labs Reviewed - No data to display  EKG EKG Interpretation  Date/Time:  Monday December 07 2018 11:30:38 EST Ventricular Rate:  99 PR Interval:  142 QRS Duration: 94 QT Interval:  342 QTC Calculation: 438 R Axis:   63 Text Interpretation:  Normal sinus rhythm Normal ECG Confirmed by Tilden Fossa [phone]) on 12/07/2018 11:41:01 AM   Radiology No results found.  Procedures Procedures (including critical care time)  Medications Ordered in ED Medications - No data to display   Initial Impression / Assessment and Plan / ED Course  I have reviewed the triage vital signs and the nursing notes.  Pertinent labs & imaging results that were available during my care of the patient were reviewed by me and considered in my medical decision making (see chart for details).   Patient is a 23 yo male who presents with palpitations which had self resolved at home prior to ED admission. Patient had an Holter monitor for 3 months with no event capture, he is  followed by cardiology for his SVT. Event today lasted 15 min with no other associated concerning symptoms. In the ED, EKG show NSR with normal vital signs. He is well appearing with a normal exam. Recommend follow up with cardiology to discuss management of SVT with possible oral agent as needed. Verbalized understanding and agree with plan.  Final Clinical Impressions(s) / ED Diagnoses   Final diagnoses:  SVT (supraventricular tachycardia) [facility])  Palpitations    ED Discharge Orders    None       Lovena Neighbours, MD 12/07/18 1349     Tilden Fossa, MD 12/10/18 [phone]

## 2018-12-07 NOTE — ED Triage Notes (Signed)
Pt reports dx "SVT" ~10 min episode this am-NAD-to triage in w/c

## 2018-12-07 NOTE — Discharge Instructions (Addendum)
Follow up with cardiology to discuss management of SVT.

## 2020-09-09 ENCOUNTER — Encounter (HOSPITAL_BASED_OUTPATIENT_CLINIC_OR_DEPARTMENT_OTHER): Payer: Self-pay | Admitting: *Deleted

## 2020-09-09 ENCOUNTER — Other Ambulatory Visit: Payer: Self-pay

## 2020-09-09 ENCOUNTER — Emergency Department (HOSPITAL_BASED_OUTPATIENT_CLINIC_OR_DEPARTMENT_OTHER)
Admission: EM | Admit: 2020-09-09 | Discharge: 2020-09-10 | Disposition: A | Discharge status: Home / Self Care | Payer: BLUE CROSS/BLUE SHIELD | Attending: Emergency Medicine | Admitting: Emergency Medicine

## 2020-09-09 DIAGNOSIS — G4482 Headache associated with sexual activity: Secondary | ICD-10-CM

## 2020-09-09 DIAGNOSIS — R519 Headache, unspecified: Secondary | ICD-10-CM | POA: Diagnosis present

## 2020-09-09 NOTE — ED Triage Notes (Signed)
Pt reports severe headache with onset after orgasm today. Reports similar episode months ago. Denies n/v. States pain was 10/10, now 4/10. He took 2 ibuprofen around 8pm

## 2020-09-09 NOTE — ED Provider Notes (Signed)
[facility] Provider Note: Andre Dell, MD, FACEP  CSN: [id number] MRN: [id number] ARRIVAL: 09/09/20 at 2105 ROOM: MH04/[facility]04   CHIEF COMPLAINT  Headache   HISTORY OF PRESENT ILLNESS  09/09/20 11:52 PM Rakwon Letourneau is a 24 y.o. male who had the sudden onset of a headache just before orgasm about 8 PM this evening.  The headache is located diffusely but most prominent in the occipital and temporal regions.  He rates the pain as a 4 out of 10 now but it was a 10 out of 10 earlier.  He took some Tylenol about 8 PM without relief.  He has had no nausea, vomiting or focal neurologic deficits.  He has had this occur in the past but never had it evaluated medically.   Past Medical History:  Diagnosis Date  . Anxiety   . Attention deficit disorder   . SVT (supraventricular tachycardia) (HCC)     Past Surgical History:  Procedure Laterality Date  . APPENDECTOMY    . KNEE SURGERY    . LAPAROSCOPIC APPENDECTOMY N/A 01/13/2017   Procedure: APPENDECTOMY LAPAROSCOPIC;  Surgeon: Glenna Fellows, MD;  Location: [facility] ORS;  Service: General;  Laterality: N/A;  . WISDOM TOOTH EXTRACTION      No family history on file.  Social History   Tobacco Use  . Smoking status: Never Smoker  . Smokeless tobacco: Current User    Types: Snuff  Vaping Use  . Vaping Use: Every day  Substance Use Topics  . Alcohol use: Yes  . Drug use: Never    Prior to Admission medications   Medication Sig Start Date End Date Taking? Authorizing Provider  FLUoxetine (PROZAC) 40 MG capsule Take 40 mg by mouth daily.    [provider]    Allergies Sulfa antibiotics   REVIEW OF SYSTEMS  Negative except as noted here or in the History of Present Illness.   PHYSICAL EXAMINATION  Initial Vital Signs Blood pressure (!) 128/101, pulse 81, temperature 98.5 F (36.9 C), temperature source Oral, resp. rate 16, height 6' (1.829 m), weight 95.3 kg, SpO2 100 %.  Examination General:  Well-developed, well-nourished male in no acute distress; appearance consistent with age of record HENT: normocephalic; atraumatic Eyes: pupils equal, round and reactive to light; extraocular muscles intact Neck: supple; no meningeal signs Heart: regular rate and rhythm Lungs: clear to auscultation bilaterally Abdomen: soft; nondistended; nontender; bowel sounds present Extremities: No deformity; full range of motion; pulses normal Neurologic: Awake, alert and oriented; motor function intact in all extremities and symmetric; no facial droop Skin: Warm and dry Psychiatric: Normal mood and affect   RESULTS  Summary of this visit's results, reviewed and interpreted by myself:   EKG Interpretation  Date/Time:    Ventricular Rate:    PR Interval:    QRS Duration:   QT Interval:    QTC Calculation:   R Axis:     Text Interpretation:        Laboratory Studies: No results found for this or any previous visit (from the past 24 hour(s)). Imaging Studies: CT Head Wo Contrast  Result Date: 09/10/2020 CLINICAL DATA:  New or worsening headache EXAM: CT HEAD WITHOUT CONTRAST TECHNIQUE: Contiguous axial images were obtained from the base of the skull through the vertex without intravenous contrast. COMPARISON:  None. FINDINGS: Brain: No evidence of acute territorial infarction, hemorrhage, hydrocephalus,extra-axial collection or mass lesion/mass effect. Normal gray-white differentiation. Ventricles are normal in size and contour. Vascular: No hyperdense vessel or unexpected  calcification. Skull: The skull is intact. No fracture or focal lesion identified. Sinuses/Orbits: The visualized paranasal sinuses and mastoid air cells are clear. The orbits and globes intact. Other: None IMPRESSION: No acute intracranial abnormality. Electronically Signed   By: Jonna Clark M.D.   On: 09/10/2020 00:17    ED COURSE and MDM  Nursing notes, initial and subsequent vitals signs, including pulse oximetry,  reviewed and interpreted by myself.  Vitals:   09/09/20 2125 09/09/20 2130  BP:  (!) 128/101  Pulse:  81  Resp:  16  Temp:  98.5 F (36.9 C)  TempSrc:  Oral  SpO2:  100%  Weight: 95.3 kg   Height: 6' (1.829 m)    Medications - No data to display  Patient advised of reassuring CT findings.  Patient advised that 6 headaches are not uncommon and are usually benign.  Prophylactic treatment with beta-blocker or other drugs are often used if headaches persist but these drugs are best managed by the patient's PCP.  PROCEDURES  Procedures   ED DIAGNOSES     ICD-10-CM   1. Headache associated with sexual activity  G44.82        Jary Louvier, Jonny Ruiz, MD 09/10/20 [phone]

## 2020-09-10 ENCOUNTER — Emergency Department (HOSPITAL_BASED_OUTPATIENT_CLINIC_OR_DEPARTMENT_OTHER): Payer: BLUE CROSS/BLUE SHIELD

## 2022-08-28 IMAGING — CT CT HEAD W/O CM
3 series · 15 of 47 positions shown, 18 images · non-contrast
Comparison: None.

CLINICAL DATA: New or worsening headache

EXAM:
CT HEAD WITHOUT CONTRAST
TECHNIQUE: Contiguous axial images were obtained from the base of the skull
through the vertex without intravenous contrast.

[Series 2: head wo · axial · 0.49mm/px · z∈[+1035,+1170]mm · 9 of 33 slices shown, 12 images]
[im 3/33  brain]
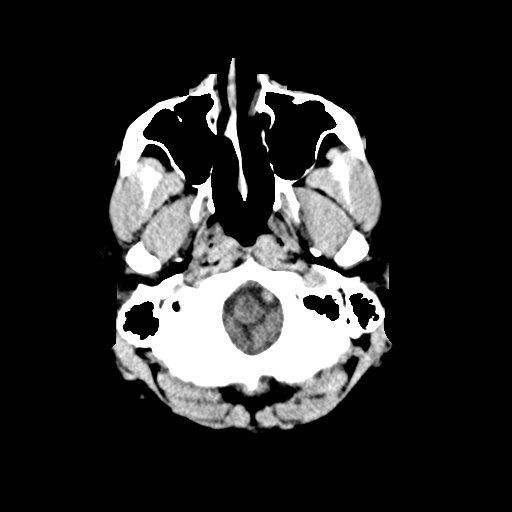
[im 3/33  bone]
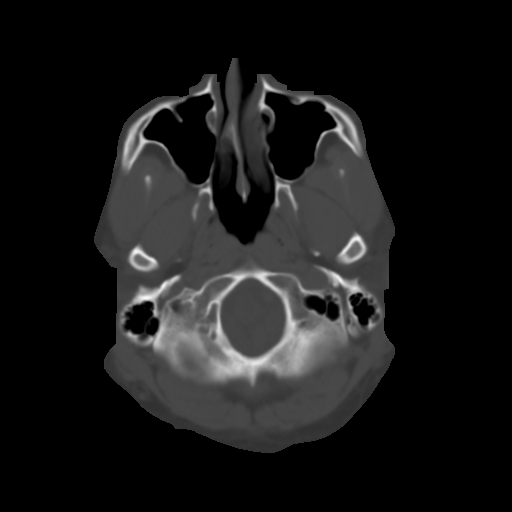
[im 6/33  brain]
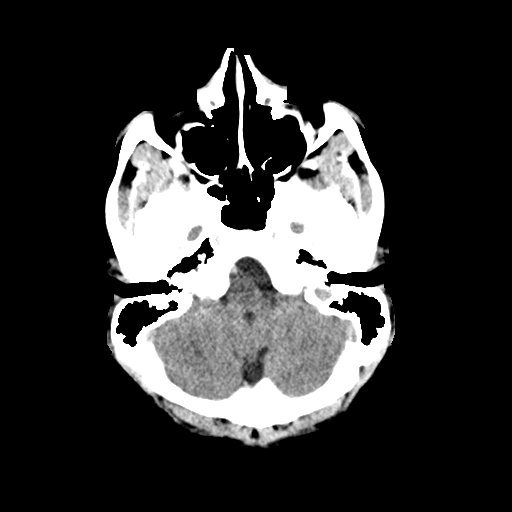
[im 9/33  brain]
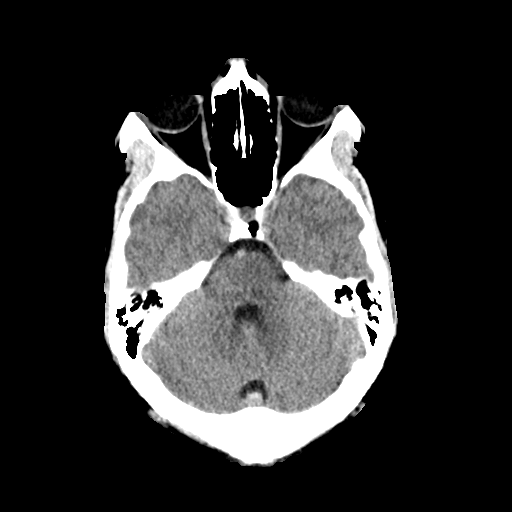
[im 13/33  brain]
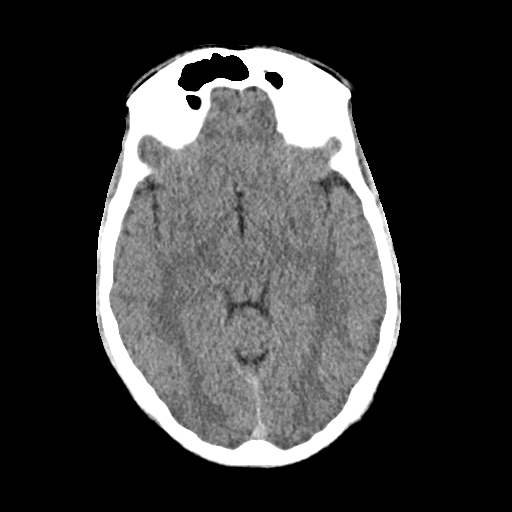
[im 17/33  brain]
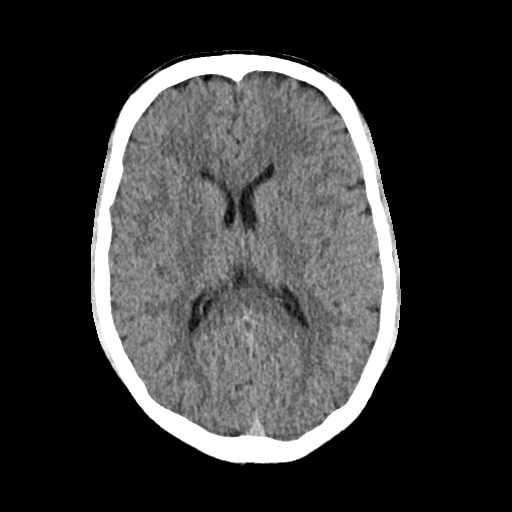
[im 17/33  bone]
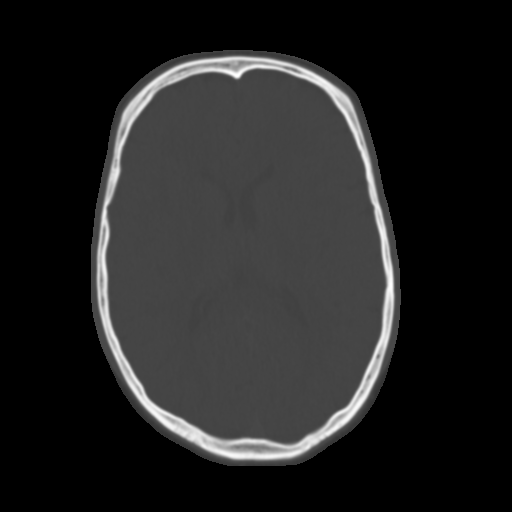
[im 20/33  brain]
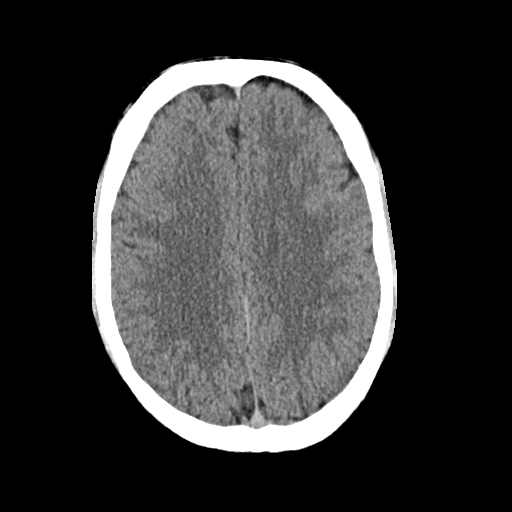
[im 24/33  brain]
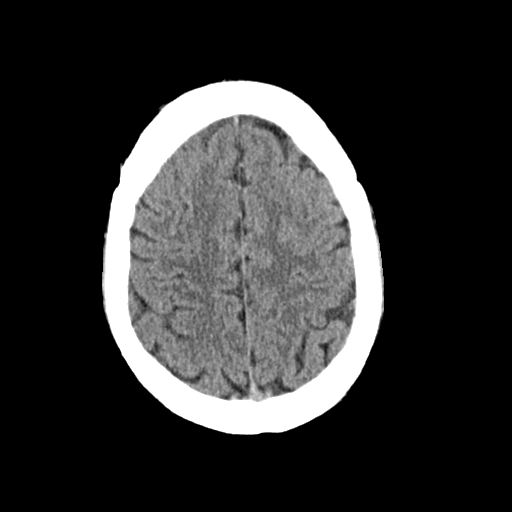
[im 27/33  brain]
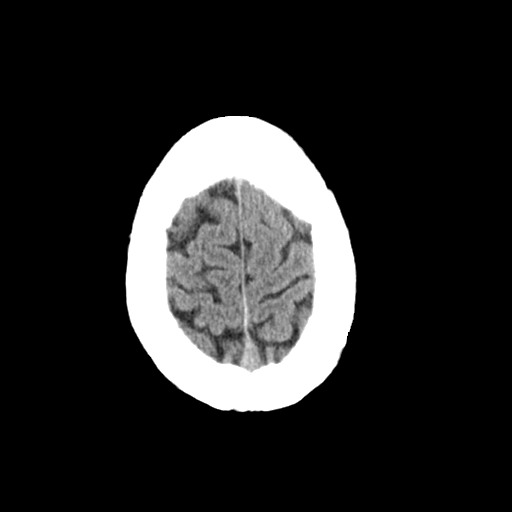
[im 30/33  brain]
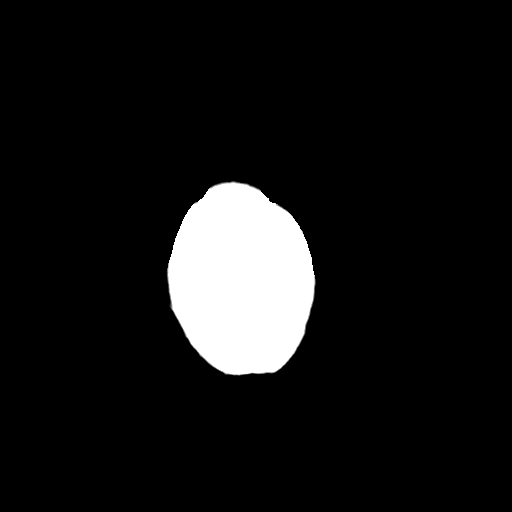
[im 30/33  bone]
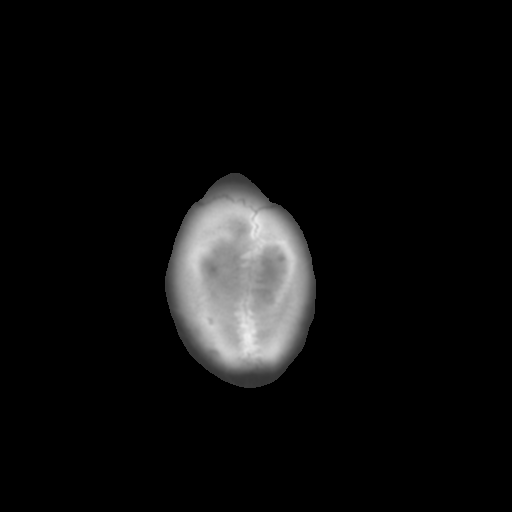

[Series 4: coronal soft · coronal · 0.32mm/px · 3 of 75 slices shown]
[im 25/75  brain]
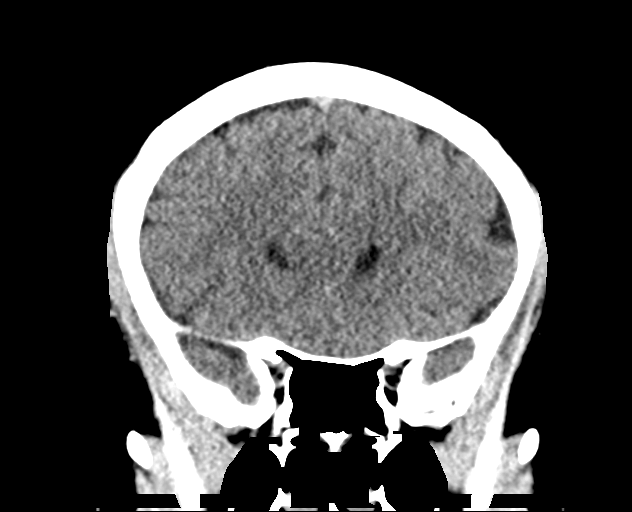
[im 33/75  brain]
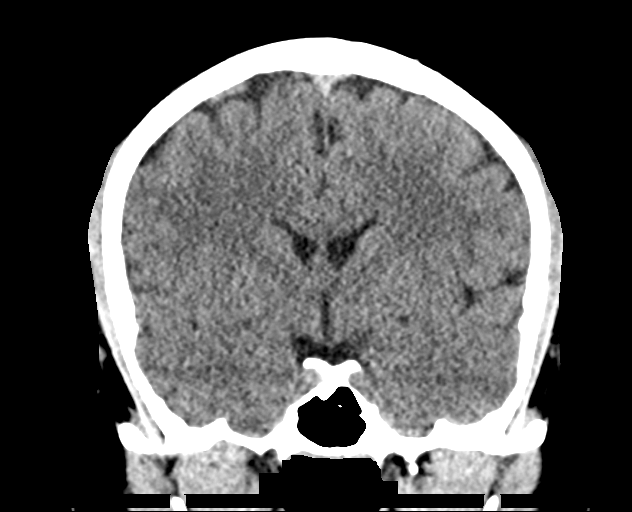
[im 42/75  brain]
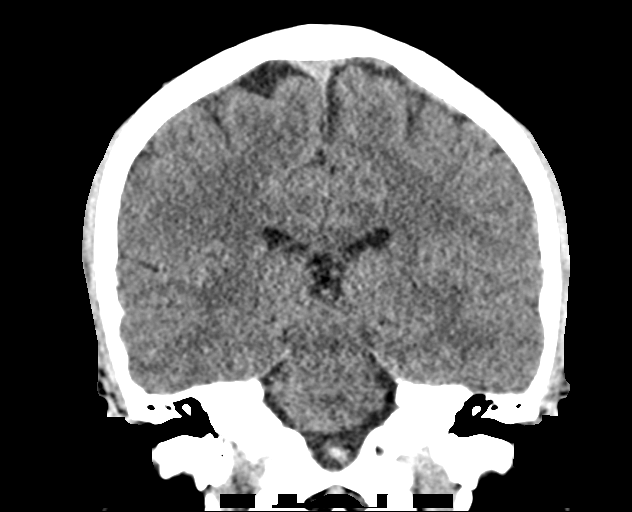

[Series 5: sag soft · sagittal · 0.34mm/px · 3 of 58 slices shown]
[im 20/58  brain]
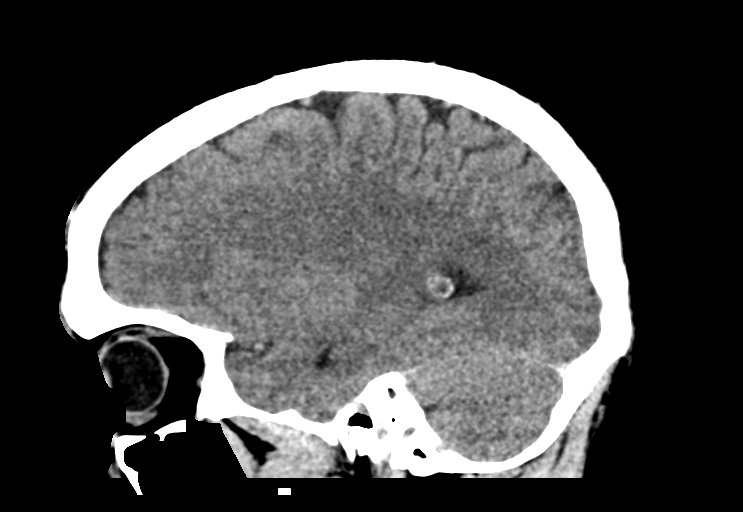
[im 29/58  brain]
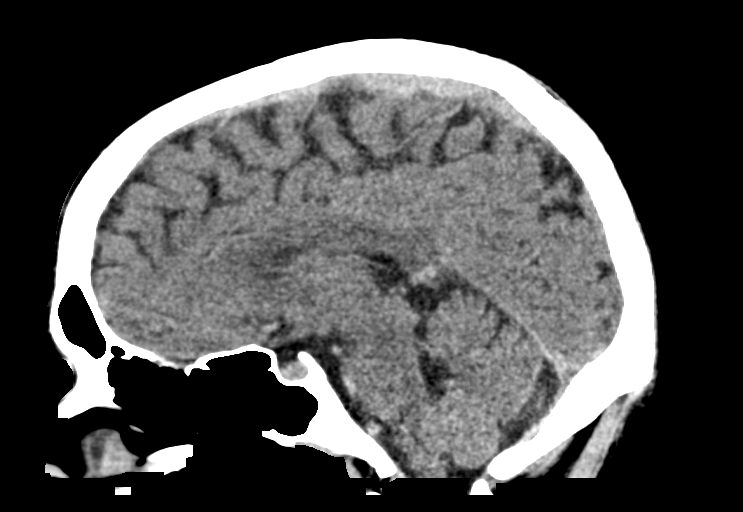
[im 39/58  brain]
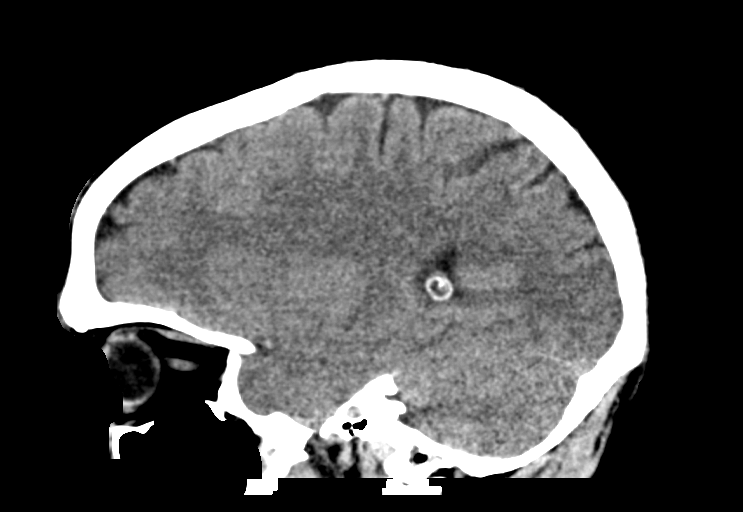

[15 of 47 positions shown; findings below may reference images not displayed]

FINDINGS: Brain: No evidence of acute territorial infarction, hemorrhage,
hydrocephalus,extra-axial collection or mass lesion/mass effect.
Normal gray-white differentiation. Ventricles are normal in size and
contour.

Vascular: No hyperdense vessel or unexpected calcification.

Skull: The skull is intact. No fracture or focal lesion identified.

Sinuses/Orbits: The visualized paranasal sinuses and mastoid air
cells are clear. The orbits and globes intact.

Other: None
IMPRESSION: No acute intracranial abnormality.
